# Patient Record
Sex: Female | Born: 1955
Health system: Southern US, Community
[De-identification: ages and names within clinical notes are randomized; demographics above are authoritative.]

## PROBLEM LIST (undated history)

## (undated) DIAGNOSIS — M1990 Unspecified osteoarthritis, unspecified site: Secondary | ICD-10-CM

## (undated) DIAGNOSIS — L719 Rosacea, unspecified: Secondary | ICD-10-CM

## (undated) DIAGNOSIS — E1169 Type 2 diabetes mellitus with other specified complication: Secondary | ICD-10-CM

## (undated) DIAGNOSIS — E7800 Pure hypercholesterolemia, unspecified: Secondary | ICD-10-CM

## (undated) DIAGNOSIS — I10 Essential (primary) hypertension: Secondary | ICD-10-CM

## (undated) DIAGNOSIS — R42 Dizziness and giddiness: Secondary | ICD-10-CM

## (undated) HISTORY — DX: Essential (primary) hypertension: I10

## (undated) HISTORY — PX: BUNIONECTOMY: SHX129

## (undated) HISTORY — DX: Rosacea, unspecified: L71.9

## (undated) HISTORY — DX: Unspecified osteoarthritis, unspecified site: M19.90

## (undated) HISTORY — DX: Type 2 diabetes mellitus with other specified complication: E11.69

## (undated) HISTORY — DX: Pure hypercholesterolemia, unspecified: E78.00

## (undated) HISTORY — DX: Dizziness and giddiness: R42

---

## 2001-07-26 ENCOUNTER — Other Ambulatory Visit: Admission: RE | Admit: 2001-07-26 | Discharge: 2001-07-26 | Payer: Self-pay | Admitting: Gynecology

## 2002-03-22 ENCOUNTER — Other Ambulatory Visit: Admission: RE | Admit: 2002-03-22 | Discharge: 2002-03-22 | Payer: Self-pay | Admitting: Gynecology

## 2003-04-05 ENCOUNTER — Other Ambulatory Visit: Admission: RE | Admit: 2003-04-05 | Discharge: 2003-04-05 | Payer: Self-pay | Admitting: Gynecology

## 2004-04-06 ENCOUNTER — Other Ambulatory Visit: Admission: RE | Admit: 2004-04-06 | Discharge: 2004-04-06 | Payer: Self-pay | Admitting: Gynecology

## 2005-05-05 ENCOUNTER — Other Ambulatory Visit: Admission: RE | Admit: 2005-05-05 | Discharge: 2005-05-05 | Payer: Self-pay | Admitting: Gynecology

## 2005-11-15 ENCOUNTER — Other Ambulatory Visit: Admission: RE | Admit: 2005-11-15 | Discharge: 2005-11-15 | Payer: Self-pay | Admitting: Gynecology

## 2006-08-08 ENCOUNTER — Other Ambulatory Visit: Admission: RE | Admit: 2006-08-08 | Discharge: 2006-08-08 | Payer: Self-pay | Admitting: Family Medicine

## 2007-09-27 ENCOUNTER — Other Ambulatory Visit: Admission: RE | Admit: 2007-09-27 | Discharge: 2007-09-27 | Payer: Self-pay | Admitting: Family Medicine

## 2008-10-14 ENCOUNTER — Other Ambulatory Visit: Admission: RE | Admit: 2008-10-14 | Discharge: 2008-10-14 | Payer: Self-pay | Admitting: Family Medicine

## 2009-11-20 ENCOUNTER — Other Ambulatory Visit: Admission: RE | Admit: 2009-11-20 | Discharge: 2009-11-20 | Payer: Self-pay | Admitting: Family Medicine

## 2013-03-12 ENCOUNTER — Other Ambulatory Visit (HOSPITAL_COMMUNITY)
Admission: RE | Admit: 2013-03-12 | Discharge: 2013-03-12 | Disposition: A | Discharge status: Home / Self Care | Payer: Managed Care, Other (non HMO) | Source: Ambulatory Visit | Attending: Family Medicine | Admitting: Family Medicine

## 2013-03-12 ENCOUNTER — Other Ambulatory Visit: Payer: Self-pay | Admitting: Family Medicine

## 2013-03-12 DIAGNOSIS — Z124 Encounter for screening for malignant neoplasm of cervix: Secondary | ICD-10-CM | POA: Insufficient documentation

## 2014-06-20 ENCOUNTER — Other Ambulatory Visit (HOSPITAL_COMMUNITY): Payer: Self-pay | Admitting: Cardiology

## 2014-06-20 DIAGNOSIS — H539 Unspecified visual disturbance: Secondary | ICD-10-CM

## 2014-06-24 ENCOUNTER — Ambulatory Visit (HOSPITAL_COMMUNITY): Payer: Managed Care, Other (non HMO) | Attending: Cardiology | Admitting: Radiology

## 2014-06-24 ENCOUNTER — Other Ambulatory Visit (HOSPITAL_COMMUNITY): Payer: Self-pay | Admitting: Family Medicine

## 2014-06-24 ENCOUNTER — Ambulatory Visit (HOSPITAL_BASED_OUTPATIENT_CLINIC_OR_DEPARTMENT_OTHER): Payer: Managed Care, Other (non HMO) | Admitting: Cardiology

## 2014-06-24 DIAGNOSIS — H5340 Unspecified visual field defects: Secondary | ICD-10-CM

## 2014-06-24 DIAGNOSIS — H539 Unspecified visual disturbance: Secondary | ICD-10-CM

## 2014-06-24 DIAGNOSIS — H53129 Transient visual loss, unspecified eye: Secondary | ICD-10-CM | POA: Insufficient documentation

## 2014-06-24 NOTE — Progress Notes (Signed)
Carotid Duplex performed. 

## 2014-06-24 NOTE — Progress Notes (Signed)
Echo performed. 

## 2014-07-01 ENCOUNTER — Other Ambulatory Visit: Payer: Self-pay | Admitting: Family Medicine

## 2014-07-01 DIAGNOSIS — H5340 Unspecified visual field defects: Secondary | ICD-10-CM

## 2014-07-06 ENCOUNTER — Ambulatory Visit
Admission: RE | Admit: 2014-07-06 | Discharge: 2014-07-06 | Disposition: A | Discharge status: Home / Self Care | Payer: Managed Care, Other (non HMO) | Source: Ambulatory Visit | Attending: Family Medicine | Admitting: Family Medicine

## 2014-07-06 DIAGNOSIS — H5340 Unspecified visual field defects: Secondary | ICD-10-CM

## 2014-07-06 MED ORDER — GADOBENATE DIMEGLUMINE 529 MG/ML IV SOLN
16.0000 mL | Freq: Once | INTRAVENOUS | Status: AC | PRN
  Administered 2014-07-06: 16 mL via INTRAVENOUS

## 2014-09-11 ENCOUNTER — Ambulatory Visit
Admission: RE | Admit: 2014-09-11 | Discharge: 2014-09-11 | Disposition: A | Discharge status: Home / Self Care | Payer: Managed Care, Other (non HMO) | Source: Ambulatory Visit | Attending: Family Medicine | Admitting: Family Medicine

## 2014-09-11 ENCOUNTER — Other Ambulatory Visit: Payer: Self-pay | Admitting: Family Medicine

## 2014-09-11 DIAGNOSIS — R102 Pelvic and perineal pain: Secondary | ICD-10-CM

## 2016-03-05 DIAGNOSIS — E7800 Pure hypercholesterolemia, unspecified: Secondary | ICD-10-CM | POA: Diagnosis not present

## 2016-03-05 DIAGNOSIS — K210 Gastro-esophageal reflux disease with esophagitis: Secondary | ICD-10-CM | POA: Diagnosis not present

## 2016-03-05 DIAGNOSIS — I10 Essential (primary) hypertension: Secondary | ICD-10-CM | POA: Diagnosis not present

## 2016-04-28 DIAGNOSIS — Z85828 Personal history of other malignant neoplasm of skin: Secondary | ICD-10-CM | POA: Diagnosis not present

## 2016-04-28 DIAGNOSIS — L308 Other specified dermatitis: Secondary | ICD-10-CM | POA: Diagnosis not present

## 2016-04-28 DIAGNOSIS — Z1283 Encounter for screening for malignant neoplasm of skin: Secondary | ICD-10-CM | POA: Diagnosis not present

## 2016-04-28 DIAGNOSIS — Z08 Encounter for follow-up examination after completed treatment for malignant neoplasm: Secondary | ICD-10-CM | POA: Diagnosis not present

## 2016-06-15 DIAGNOSIS — H524 Presbyopia: Secondary | ICD-10-CM | POA: Diagnosis not present

## 2016-06-15 DIAGNOSIS — H40013 Open angle with borderline findings, low risk, bilateral: Secondary | ICD-10-CM | POA: Diagnosis not present

## 2016-06-15 DIAGNOSIS — H2513 Age-related nuclear cataract, bilateral: Secondary | ICD-10-CM | POA: Diagnosis not present

## 2016-06-15 DIAGNOSIS — H25013 Cortical age-related cataract, bilateral: Secondary | ICD-10-CM | POA: Diagnosis not present

## 2016-06-15 DIAGNOSIS — H43811 Vitreous degeneration, right eye: Secondary | ICD-10-CM | POA: Diagnosis not present

## 2016-09-07 ENCOUNTER — Other Ambulatory Visit (HOSPITAL_COMMUNITY)
Admission: RE | Admit: 2016-09-07 | Discharge: 2016-09-07 | Disposition: A | Discharge status: Home / Self Care | Payer: BLUE CROSS/BLUE SHIELD | Source: Ambulatory Visit | Attending: Family Medicine | Admitting: Family Medicine

## 2016-09-07 ENCOUNTER — Other Ambulatory Visit: Payer: Self-pay | Admitting: Family Medicine

## 2016-09-07 DIAGNOSIS — E7800 Pure hypercholesterolemia, unspecified: Secondary | ICD-10-CM | POA: Diagnosis not present

## 2016-09-07 DIAGNOSIS — K219 Gastro-esophageal reflux disease without esophagitis: Secondary | ICD-10-CM | POA: Diagnosis not present

## 2016-09-07 DIAGNOSIS — Z1151 Encounter for screening for human papillomavirus (HPV): Secondary | ICD-10-CM | POA: Diagnosis not present

## 2016-09-07 DIAGNOSIS — Z124 Encounter for screening for malignant neoplasm of cervix: Secondary | ICD-10-CM | POA: Insufficient documentation

## 2016-09-07 DIAGNOSIS — I10 Essential (primary) hypertension: Secondary | ICD-10-CM | POA: Diagnosis not present

## 2016-09-07 DIAGNOSIS — Z0000 Encounter for general adult medical examination without abnormal findings: Secondary | ICD-10-CM | POA: Diagnosis not present

## 2016-09-08 LAB — CYTOLOGY - PAP
DIAGNOSIS: NEGATIVE
HPV: NOT DETECTED

## 2017-01-13 DIAGNOSIS — H40012 Open angle with borderline findings, low risk, left eye: Secondary | ICD-10-CM | POA: Diagnosis not present

## 2017-01-13 DIAGNOSIS — H40013 Open angle with borderline findings, low risk, bilateral: Secondary | ICD-10-CM | POA: Diagnosis not present

## 2017-01-13 DIAGNOSIS — H04123 Dry eye syndrome of bilateral lacrimal glands: Secondary | ICD-10-CM | POA: Diagnosis not present

## 2017-01-13 DIAGNOSIS — H40011 Open angle with borderline findings, low risk, right eye: Secondary | ICD-10-CM | POA: Diagnosis not present

## 2017-01-13 DIAGNOSIS — M3501 Sicca syndrome with keratoconjunctivitis: Secondary | ICD-10-CM | POA: Diagnosis not present

## 2017-01-17 DIAGNOSIS — Z1231 Encounter for screening mammogram for malignant neoplasm of breast: Secondary | ICD-10-CM | POA: Diagnosis not present

## 2017-02-09 DIAGNOSIS — Z23 Encounter for immunization: Secondary | ICD-10-CM | POA: Diagnosis not present

## 2017-04-19 DIAGNOSIS — H40011 Open angle with borderline findings, low risk, right eye: Secondary | ICD-10-CM | POA: Diagnosis not present

## 2017-05-19 ENCOUNTER — Ambulatory Visit
Admission: RE | Admit: 2017-05-19 | Discharge: 2017-05-19 | Disposition: A | Discharge status: Home / Self Care | Payer: BLUE CROSS/BLUE SHIELD | Source: Ambulatory Visit | Attending: Family Medicine | Admitting: Family Medicine

## 2017-05-19 ENCOUNTER — Other Ambulatory Visit: Payer: Self-pay | Admitting: Family Medicine

## 2017-05-19 DIAGNOSIS — M7662 Achilles tendinitis, left leg: Secondary | ICD-10-CM

## 2017-05-19 DIAGNOSIS — K1370 Unspecified lesions of oral mucosa: Secondary | ICD-10-CM | POA: Diagnosis not present

## 2017-05-19 DIAGNOSIS — M25572 Pain in left ankle and joints of left foot: Secondary | ICD-10-CM | POA: Diagnosis not present

## 2017-05-27 DIAGNOSIS — Z23 Encounter for immunization: Secondary | ICD-10-CM | POA: Diagnosis not present

## 2017-06-29 DIAGNOSIS — H40011 Open angle with borderline findings, low risk, right eye: Secondary | ICD-10-CM | POA: Diagnosis not present

## 2017-06-29 DIAGNOSIS — M3501 Sicca syndrome with keratoconjunctivitis: Secondary | ICD-10-CM | POA: Diagnosis not present

## 2017-06-29 DIAGNOSIS — H04123 Dry eye syndrome of bilateral lacrimal glands: Secondary | ICD-10-CM | POA: Diagnosis not present

## 2017-06-29 DIAGNOSIS — H40013 Open angle with borderline findings, low risk, bilateral: Secondary | ICD-10-CM | POA: Diagnosis not present

## 2017-06-29 DIAGNOSIS — H40012 Open angle with borderline findings, low risk, left eye: Secondary | ICD-10-CM | POA: Diagnosis not present

## 2017-06-29 DIAGNOSIS — H01003 Unspecified blepharitis right eye, unspecified eyelid: Secondary | ICD-10-CM | POA: Diagnosis not present

## 2017-09-13 DIAGNOSIS — E2839 Other primary ovarian failure: Secondary | ICD-10-CM | POA: Diagnosis not present

## 2017-09-13 DIAGNOSIS — I10 Essential (primary) hypertension: Secondary | ICD-10-CM | POA: Diagnosis not present

## 2017-09-13 DIAGNOSIS — Z0000 Encounter for general adult medical examination without abnormal findings: Secondary | ICD-10-CM | POA: Diagnosis not present

## 2017-09-13 DIAGNOSIS — E7800 Pure hypercholesterolemia, unspecified: Secondary | ICD-10-CM | POA: Diagnosis not present

## 2017-09-26 ENCOUNTER — Ambulatory Visit (HOSPITAL_COMMUNITY)
Admission: RE | Admit: 2017-09-26 | Discharge: 2017-09-26 | Disposition: A | Discharge status: Home / Self Care | Payer: BLUE CROSS/BLUE SHIELD | Source: Ambulatory Visit | Attending: Gastroenterology | Admitting: Gastroenterology

## 2017-09-26 ENCOUNTER — Other Ambulatory Visit (HOSPITAL_COMMUNITY): Payer: Self-pay | Admitting: Gastroenterology

## 2017-09-26 DIAGNOSIS — Z1211 Encounter for screening for malignant neoplasm of colon: Secondary | ICD-10-CM | POA: Diagnosis not present

## 2017-09-26 DIAGNOSIS — Q438 Other specified congenital malformations of intestine: Secondary | ICD-10-CM

## 2017-09-26 DIAGNOSIS — Z8371 Family history of colonic polyps: Secondary | ICD-10-CM | POA: Diagnosis not present

## 2017-09-26 DIAGNOSIS — K649 Unspecified hemorrhoids: Secondary | ICD-10-CM | POA: Diagnosis not present

## 2017-11-16 DIAGNOSIS — M8588 Other specified disorders of bone density and structure, other site: Secondary | ICD-10-CM | POA: Diagnosis not present

## 2017-11-16 DIAGNOSIS — E2839 Other primary ovarian failure: Secondary | ICD-10-CM | POA: Diagnosis not present

## 2018-01-04 DIAGNOSIS — L821 Other seborrheic keratosis: Secondary | ICD-10-CM | POA: Diagnosis not present

## 2018-01-04 DIAGNOSIS — Z1283 Encounter for screening for malignant neoplasm of skin: Secondary | ICD-10-CM | POA: Diagnosis not present

## 2018-01-19 DIAGNOSIS — Z1231 Encounter for screening mammogram for malignant neoplasm of breast: Secondary | ICD-10-CM | POA: Diagnosis not present

## 2018-03-14 DIAGNOSIS — I10 Essential (primary) hypertension: Secondary | ICD-10-CM | POA: Diagnosis not present

## 2018-03-14 DIAGNOSIS — E7800 Pure hypercholesterolemia, unspecified: Secondary | ICD-10-CM | POA: Diagnosis not present

## 2018-04-12 DIAGNOSIS — S86812A Strain of other muscle(s) and tendon(s) at lower leg level, left leg, initial encounter: Secondary | ICD-10-CM | POA: Diagnosis not present

## 2018-04-24 DIAGNOSIS — H6983 Other specified disorders of Eustachian tube, bilateral: Secondary | ICD-10-CM | POA: Diagnosis not present

## 2018-04-24 DIAGNOSIS — J310 Chronic rhinitis: Secondary | ICD-10-CM | POA: Diagnosis not present

## 2018-07-06 DIAGNOSIS — M3501 Sicca syndrome with keratoconjunctivitis: Secondary | ICD-10-CM | POA: Diagnosis not present

## 2018-07-06 DIAGNOSIS — H04123 Dry eye syndrome of bilateral lacrimal glands: Secondary | ICD-10-CM | POA: Diagnosis not present

## 2018-07-06 DIAGNOSIS — H25013 Cortical age-related cataract, bilateral: Secondary | ICD-10-CM | POA: Diagnosis not present

## 2018-07-06 DIAGNOSIS — H40013 Open angle with borderline findings, low risk, bilateral: Secondary | ICD-10-CM | POA: Diagnosis not present

## 2018-09-14 DIAGNOSIS — I10 Essential (primary) hypertension: Secondary | ICD-10-CM | POA: Diagnosis not present

## 2018-09-14 DIAGNOSIS — E7800 Pure hypercholesterolemia, unspecified: Secondary | ICD-10-CM | POA: Diagnosis not present

## 2018-09-14 DIAGNOSIS — Z0000 Encounter for general adult medical examination without abnormal findings: Secondary | ICD-10-CM | POA: Diagnosis not present

## 2018-10-10 DIAGNOSIS — H8111 Benign paroxysmal vertigo, right ear: Secondary | ICD-10-CM | POA: Diagnosis not present

## 2018-10-17 DIAGNOSIS — H8111 Benign paroxysmal vertigo, right ear: Secondary | ICD-10-CM | POA: Diagnosis not present

## 2019-01-16 IMAGING — CR DG BE W/ CM - WO/W KUB
14 series · 14 of 14 positions shown · IV contrast (agent unspecified)
Comparison: None.

CLINICAL DATA: Incomplete colonoscopy today.

EXAM:
BE WITH CONTRAST - WITHOUT AND WITH KUB
CONTRAST:  Thin barium per rectum
FLUOROSCOPY TIME:  Fluoroscopy Time:  1 minutes 6 seconds
Radiation Exposure Index (if provided by the fluoroscopic device):
Number of Acquired Spot Images: 0

[t abdomen supine]
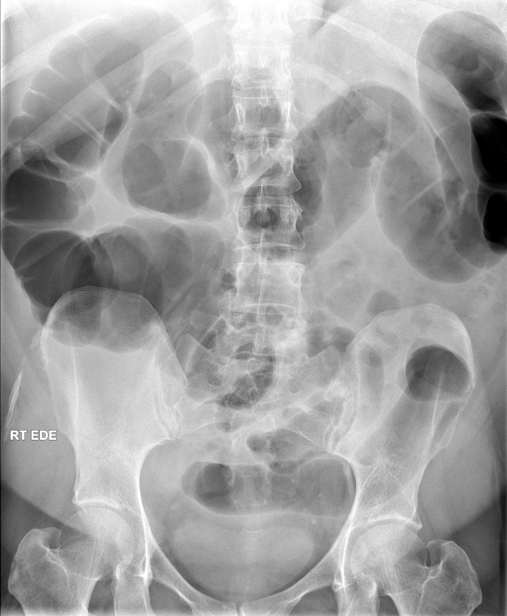

[fluoro_barium singleshot_bw (1 of 12)]
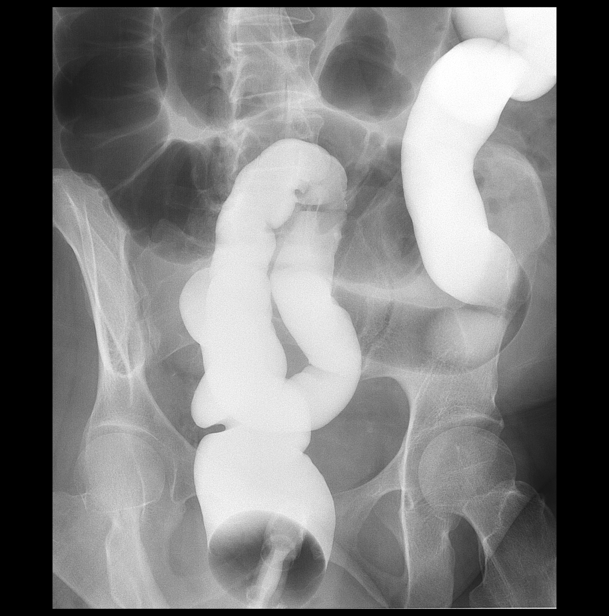

[fluoro_barium singleshot_bw (2 of 12)]
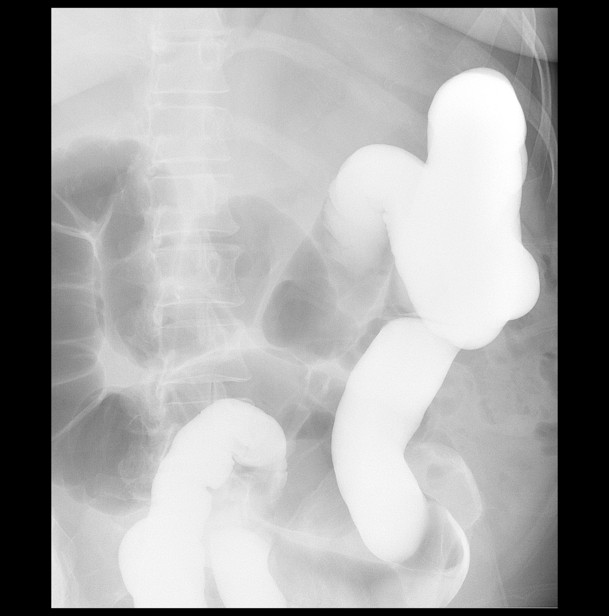

[fluoro_barium singleshot_bw (3 of 12)]
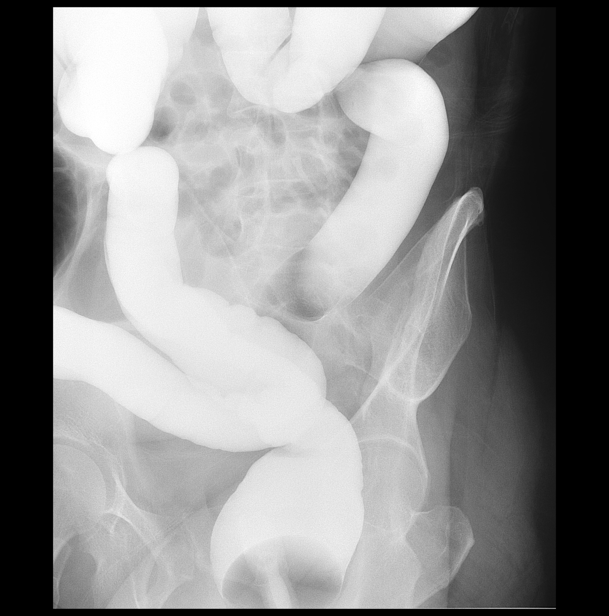

[fluoro_barium singleshot_bw (4 of 12)]
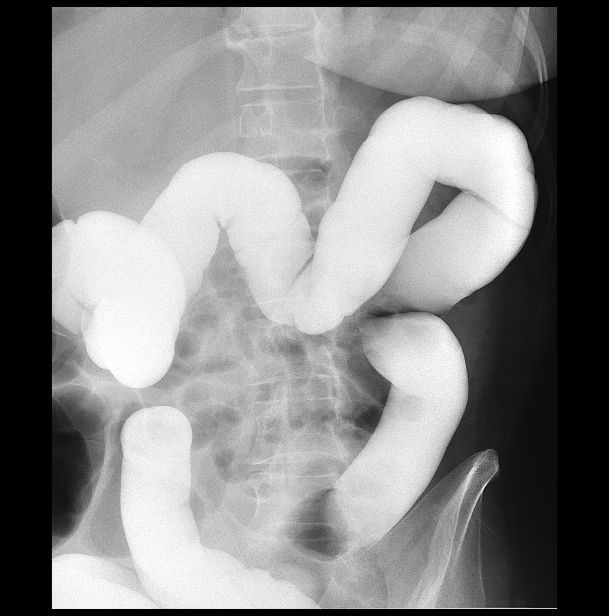

[fluoro_barium singleshot_bw (5 of 12)]
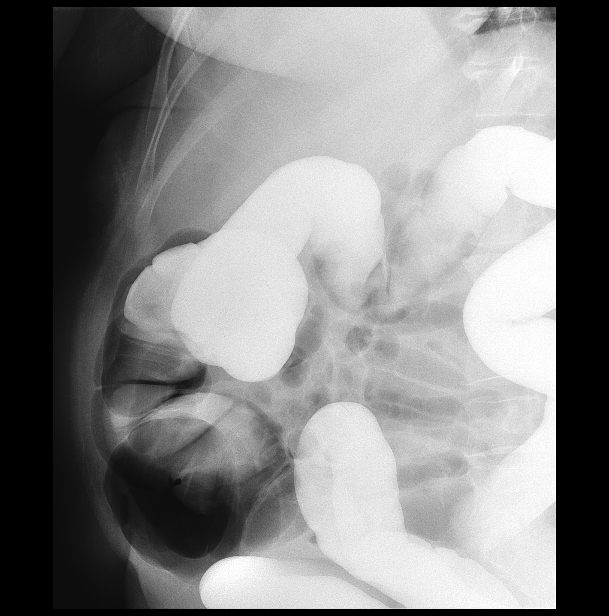

[fluoro_barium singleshot_bw (6 of 12)]
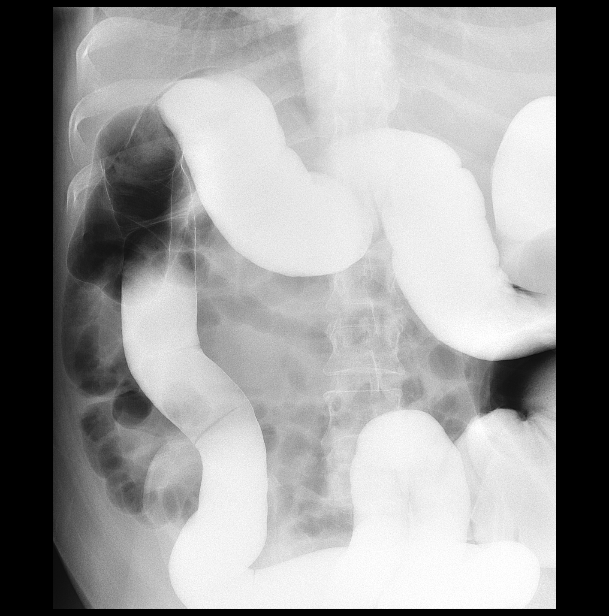

[fluoro_barium singleshot_bw (7 of 12)]
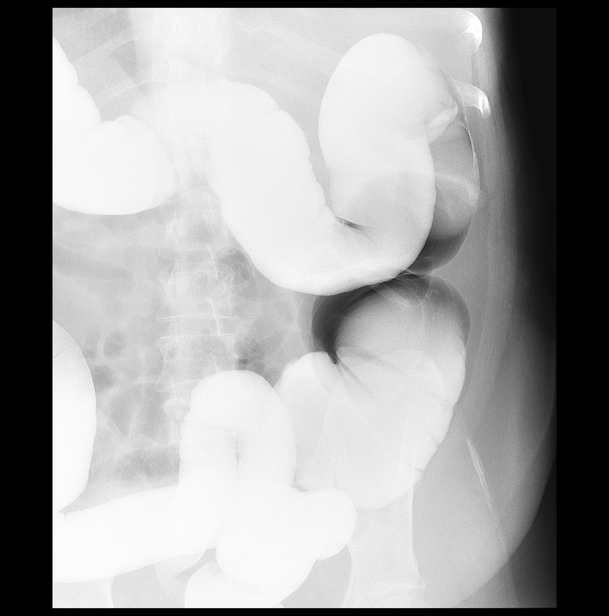

[fluoro_barium singleshot_bw (8 of 12)]
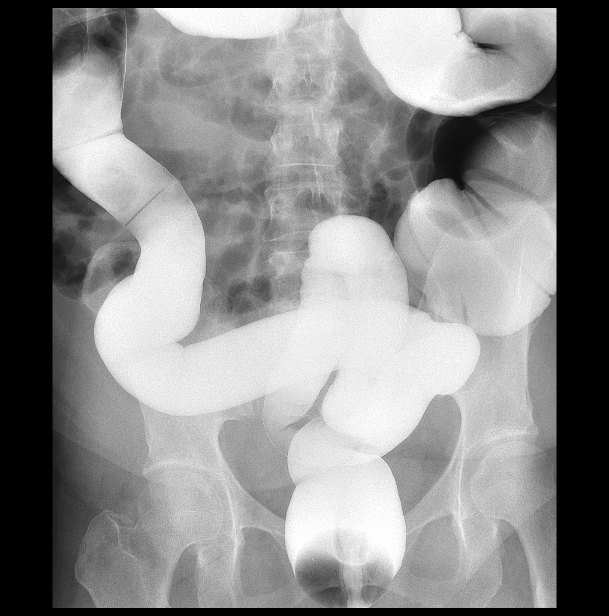

[fluoro_barium singleshot_bw (9 of 12)]
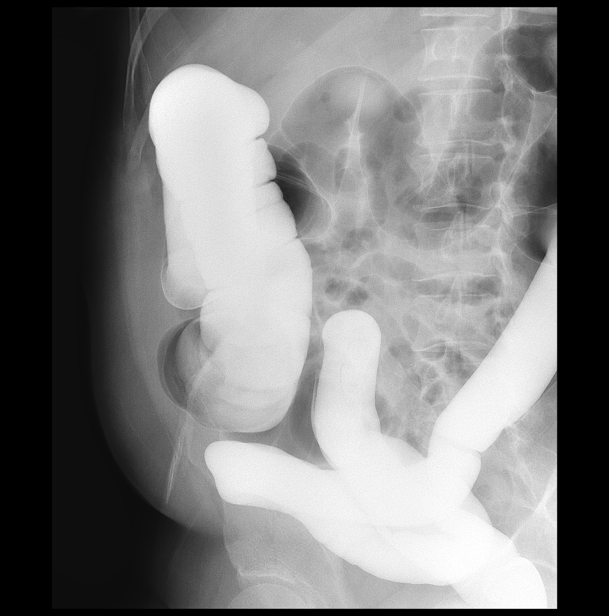

[fluoro_barium singleshot_bw (10 of 12)]
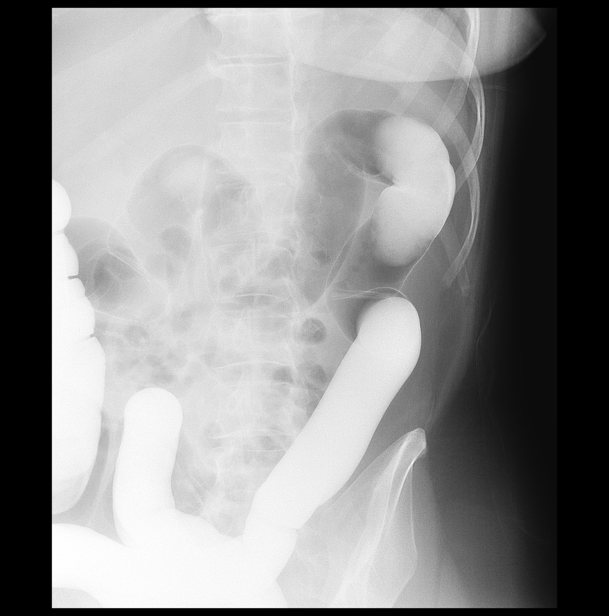

[fluoro_barium singleshot_bw (11 of 12)]
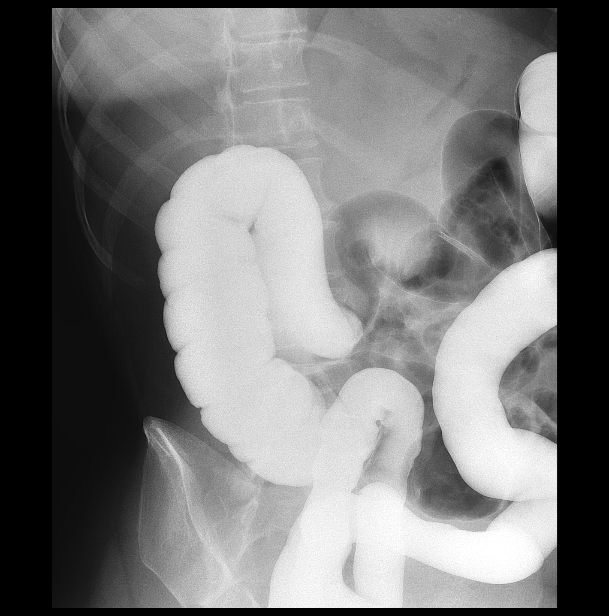

[fluoro_barium singleshot_bw (12 of 12)]
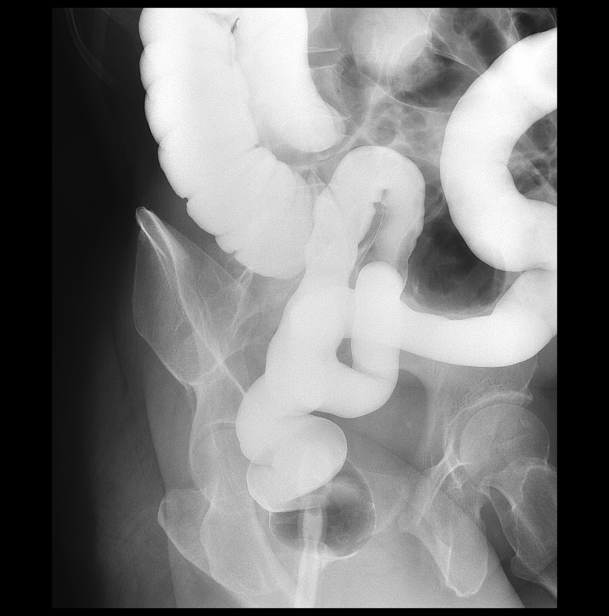

[t abdomen barium ap]
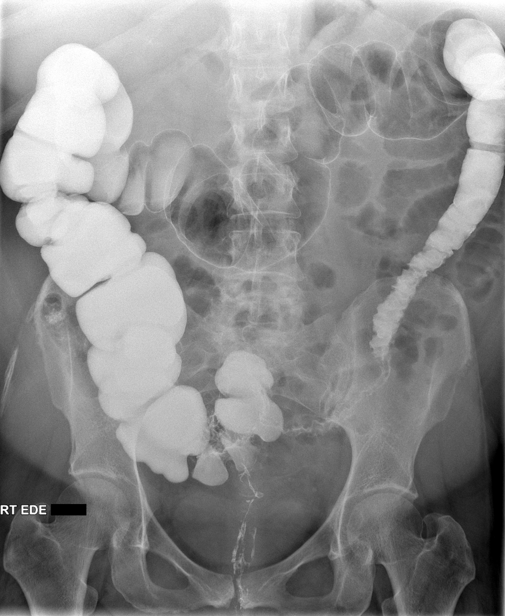

[14 of 14 positions shown; findings below may reference images not displayed]

FINDINGS: Preliminary KUB reveals extensive colonic gas related to incomplete
colonoscopy today. No biopsy was performed at colonoscopy.

Colonic mucosa is smooth. No diverticular change. No polyp or mass.
The entire colon was filled with contrast.

Postvoid imaging reveals no significant abnormality.
IMPRESSION: Negative single contrast barium enema. Study is limited by large
amount of colonic gas.

## 2019-03-15 DIAGNOSIS — E7800 Pure hypercholesterolemia, unspecified: Secondary | ICD-10-CM | POA: Diagnosis not present

## 2019-03-15 DIAGNOSIS — I10 Essential (primary) hypertension: Secondary | ICD-10-CM | POA: Diagnosis not present

## 2019-03-22 DIAGNOSIS — Z1231 Encounter for screening mammogram for malignant neoplasm of breast: Secondary | ICD-10-CM | POA: Diagnosis not present

## 2019-03-28 DIAGNOSIS — Z1283 Encounter for screening for malignant neoplasm of skin: Secondary | ICD-10-CM | POA: Diagnosis not present

## 2019-03-28 DIAGNOSIS — L570 Actinic keratosis: Secondary | ICD-10-CM | POA: Diagnosis not present

## 2019-03-28 DIAGNOSIS — X32XXXA Exposure to sunlight, initial encounter: Secondary | ICD-10-CM | POA: Diagnosis not present

## 2019-03-28 DIAGNOSIS — L821 Other seborrheic keratosis: Secondary | ICD-10-CM | POA: Diagnosis not present

## 2019-04-20 DIAGNOSIS — I10 Essential (primary) hypertension: Secondary | ICD-10-CM | POA: Diagnosis not present

## 2019-05-14 DIAGNOSIS — R222 Localized swelling, mass and lump, trunk: Secondary | ICD-10-CM | POA: Diagnosis not present

## 2019-05-17 ENCOUNTER — Other Ambulatory Visit: Payer: Self-pay | Admitting: Family Medicine

## 2019-05-17 DIAGNOSIS — R222 Localized swelling, mass and lump, trunk: Secondary | ICD-10-CM

## 2019-05-18 ENCOUNTER — Ambulatory Visit
Admission: RE | Admit: 2019-05-18 | Discharge: 2019-05-18 | Disposition: A | Discharge status: Home / Self Care | Payer: BLUE CROSS/BLUE SHIELD | Source: Ambulatory Visit | Attending: Family Medicine | Admitting: Family Medicine

## 2019-05-18 DIAGNOSIS — D171 Benign lipomatous neoplasm of skin and subcutaneous tissue of trunk: Secondary | ICD-10-CM | POA: Diagnosis not present

## 2019-05-18 DIAGNOSIS — R222 Localized swelling, mass and lump, trunk: Secondary | ICD-10-CM

## 2019-07-12 DIAGNOSIS — H2513 Age-related nuclear cataract, bilateral: Secondary | ICD-10-CM | POA: Diagnosis not present

## 2019-07-12 DIAGNOSIS — H524 Presbyopia: Secondary | ICD-10-CM | POA: Diagnosis not present

## 2019-07-12 DIAGNOSIS — H40013 Open angle with borderline findings, low risk, bilateral: Secondary | ICD-10-CM | POA: Diagnosis not present

## 2019-07-12 DIAGNOSIS — H25013 Cortical age-related cataract, bilateral: Secondary | ICD-10-CM | POA: Diagnosis not present

## 2019-07-12 DIAGNOSIS — H04123 Dry eye syndrome of bilateral lacrimal glands: Secondary | ICD-10-CM | POA: Diagnosis not present

## 2019-09-20 DIAGNOSIS — Z23 Encounter for immunization: Secondary | ICD-10-CM | POA: Diagnosis not present

## 2019-09-20 DIAGNOSIS — Z0000 Encounter for general adult medical examination without abnormal findings: Secondary | ICD-10-CM | POA: Diagnosis not present

## 2019-09-20 DIAGNOSIS — I10 Essential (primary) hypertension: Secondary | ICD-10-CM | POA: Diagnosis not present

## 2019-09-20 DIAGNOSIS — E7800 Pure hypercholesterolemia, unspecified: Secondary | ICD-10-CM | POA: Diagnosis not present

## 2020-01-23 ENCOUNTER — Other Ambulatory Visit: Payer: Self-pay

## 2020-01-23 ENCOUNTER — Ambulatory Visit: Payer: BC Managed Care – PPO | Admitting: Podiatry

## 2020-01-23 DIAGNOSIS — M6740 Ganglion, unspecified site: Secondary | ICD-10-CM

## 2020-01-23 DIAGNOSIS — B351 Tinea unguium: Secondary | ICD-10-CM

## 2020-01-23 MED ORDER — TERBINAFINE HCL 250 MG PO TABS: 250.0000 mg | ORAL_TABLET | Freq: Every day | ORAL | 0 refills | Status: DC

## 2020-01-23 NOTE — Patient Instructions (Signed)
Mucoid Cyst

## 2020-01-27 NOTE — Progress Notes (Signed)
   Subjective: 64 y.o. female presenting today as a new patient with a chief complaint of sharp pain noted to the left 2nd digit that has been gradually worsening for the past 5 years. She reports a cyst-like lesion present which produces clear fluid when lanced. Touching the area increases the pain. She has had the lesion lanced in the past with no significant relief.  She also notes possible nail fungus of the nails bilaterally that have been present for the past several months. She reports thickening and discoloration of the nails. She denies any modifying factors and has not had any treatment for the symptoms. Patient is here for further evaluation and treatment.   No past medical history on file.  Objective: Physical Exam General: The patient is alert and oriented x3 in no acute distress.  Dermatology: Hyperkeratotic, discolored, thickened, onychodystrophy noted to nails 1-5 bilaterally. Cystic lesion noted overlying DIPJ approximately 2 mm in diameter. Skin is warm, dry and supple bilateral lower extremities. Negative for open lesions or macerations.  Vascular: Palpable pedal pulses bilaterally. No edema or erythema noted. Capillary refill within normal limits.  Neurological: Epicritic and protective threshold grossly intact bilaterally.   Musculoskeletal Exam: Range of motion within normal limits to all pedal and ankle joints bilateral. Muscle strength 5/5 in all groups bilateral.   Assessment: #1 Onychomycosis noted to nails 1-5 bilaterally #2 Hyperkeratotic nails noted 1-5 bilaterally  #3 Mucoid cyst left 2nd DIPJ  Plan of Care:  #1 Patient was evaluated. #2 Recommended simply observing mucoid cyst for now. Patient does not want arthroplasty of the toe at this time.  #3 Prescription for Lamisil 250 mg #90 provided to patient.  #4 Inquired about liver function and patient declines any known history of issue or problems.  #5 Return to clinic as needed.   Works at [employer]  [place] in [employer].    Felecia Shelling, DPM [facility]  Dr. Felecia Shelling, DPM    [address]                                        [place], [place] [address]                Office [phone]  Fax [phone]

## 2020-02-08 DIAGNOSIS — Z20822 Contact with and (suspected) exposure to covid-19: Secondary | ICD-10-CM | POA: Diagnosis not present

## 2020-03-20 DIAGNOSIS — E7800 Pure hypercholesterolemia, unspecified: Secondary | ICD-10-CM | POA: Diagnosis not present

## 2020-03-20 DIAGNOSIS — I10 Essential (primary) hypertension: Secondary | ICD-10-CM | POA: Diagnosis not present

## 2020-03-25 DIAGNOSIS — Z1231 Encounter for screening mammogram for malignant neoplasm of breast: Secondary | ICD-10-CM | POA: Diagnosis not present

## 2020-04-07 DIAGNOSIS — R928 Other abnormal and inconclusive findings on diagnostic imaging of breast: Secondary | ICD-10-CM | POA: Diagnosis not present

## 2020-06-09 ENCOUNTER — Ambulatory Visit: Payer: BC Managed Care – PPO | Admitting: Podiatry

## 2020-06-09 ENCOUNTER — Other Ambulatory Visit: Payer: Self-pay

## 2020-06-09 ENCOUNTER — Ambulatory Visit (INDEPENDENT_AMBULATORY_CARE_PROVIDER_SITE_OTHER): Payer: BC Managed Care – PPO

## 2020-06-09 DIAGNOSIS — G5762 Lesion of plantar nerve, left lower limb: Secondary | ICD-10-CM

## 2020-06-09 DIAGNOSIS — M6740 Ganglion, unspecified site: Secondary | ICD-10-CM | POA: Diagnosis not present

## 2020-06-09 NOTE — Patient Instructions (Signed)

## 2020-06-09 NOTE — Progress Notes (Signed)
   Subjective: 64 y.o. female presenting today for evaluation of a new complaint regarding a knot on the bottom of the forefoot.  Patient states that when she walks barefoot on hardwood floors it makes it difficult because she feels like there is a knot on the bottom of her second toe.  It is very symptomatic when barefoot.  Patient states that is when she wears good supportive sneakers she does not feel anything.  This is been intermittent has been ongoing for the last couple of years. There has been no change to the mucoid cyst overlying the DIPJ of the second digit.  No past medical history on file.  Objective: Physical Exam General: The patient is alert and oriented x3 in no acute distress.  Dermatology:  Cystic lesion noted overlying DIPJ approximately 2 mm in diameter. Skin is warm, dry and supple bilateral lower extremities. Negative for open lesions or macerations.  Vascular: Palpable pedal pulses bilaterally. No edema or erythema noted. Capillary refill within normal limits.  Neurological: Epicritic and protective threshold grossly intact bilaterally.   Musculoskeletal Exam: Range of motion within normal limits to all pedal and ankle joints bilateral. Muscle strength 5/5 in all groups bilateral.  There is some pain on palpation and lateral compression of the second intermetatarsal region of the left foot consistent with findings of a Morton's neuroma  Assessment: #1 Morton's neuroma second interspace left #2 mucoid cyst left 2nd DIPJ  Plan of Care:  #1 Patient was evaluated. #2 continue observing mucoid cyst for now. Patient does not want arthroplasty of the toe at this time.  #3 patient has completed the Lamisil 250 mg #90 daily as prescribed.  There is significant improvement noted #4 recommend conservative treatment at this time.  Patient states that the Morton's neuroma is not necessarily painful but she does feel when barefoot.  Recommend good supportive shoes even in the  house #5 return to clinic as needed  Works at [employer] [place] in [facility] department.    Felecia Shelling, DPM [facility]  Dr. Felecia Shelling, DPM    [address]                                        [place], [place] [address]                Office [phone]  Fax [phone]

## 2020-07-21 DIAGNOSIS — H25013 Cortical age-related cataract, bilateral: Secondary | ICD-10-CM | POA: Diagnosis not present

## 2020-07-21 DIAGNOSIS — H2513 Age-related nuclear cataract, bilateral: Secondary | ICD-10-CM | POA: Diagnosis not present

## 2020-07-21 DIAGNOSIS — H04123 Dry eye syndrome of bilateral lacrimal glands: Secondary | ICD-10-CM | POA: Diagnosis not present

## 2020-07-21 DIAGNOSIS — H40013 Open angle with borderline findings, low risk, bilateral: Secondary | ICD-10-CM | POA: Diagnosis not present

## 2020-09-22 DIAGNOSIS — Z0000 Encounter for general adult medical examination without abnormal findings: Secondary | ICD-10-CM | POA: Diagnosis not present

## 2020-09-22 DIAGNOSIS — E7800 Pure hypercholesterolemia, unspecified: Secondary | ICD-10-CM | POA: Diagnosis not present

## 2020-09-22 DIAGNOSIS — I10 Essential (primary) hypertension: Secondary | ICD-10-CM | POA: Diagnosis not present

## 2020-09-22 DIAGNOSIS — K219 Gastro-esophageal reflux disease without esophagitis: Secondary | ICD-10-CM | POA: Diagnosis not present

## 2021-03-19 DIAGNOSIS — E7800 Pure hypercholesterolemia, unspecified: Secondary | ICD-10-CM | POA: Diagnosis not present

## 2021-03-19 DIAGNOSIS — I10 Essential (primary) hypertension: Secondary | ICD-10-CM | POA: Diagnosis not present

## 2021-03-23 IMAGING — US ULTRASOUND ABDOMEN LIMITED
1 series · 9 of 9 positions shown · non-contrast
Comparison: None.

CLINICAL DATA: 63-year-old presenting with a palpable lump in the
LEFT upper abdomen which she initially noted approximately 1 week
ago.

EXAM:
ULTRASOUND ABDOMEN LIMITED

[Series 1: ultrasound abdomen limited · 0.07mm/px · 9 acquisitions, 9 frames shown]
[im 1/9]
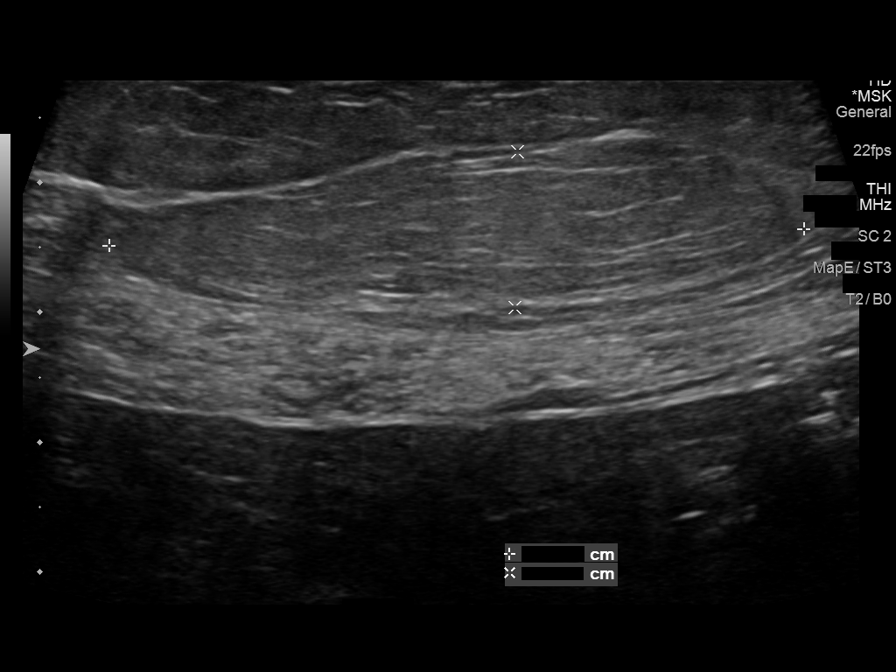
[im 2/9]
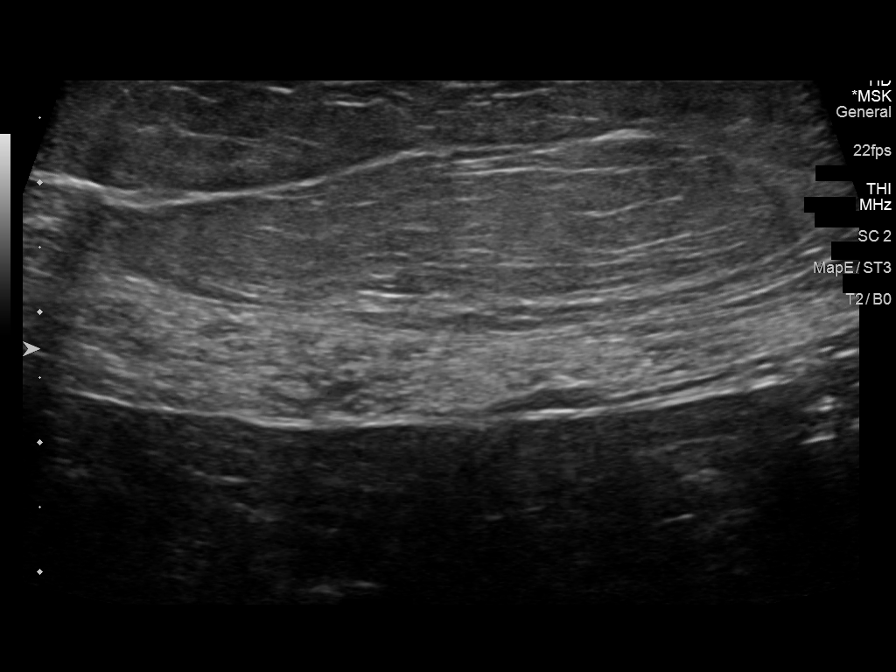
[im 3/9]
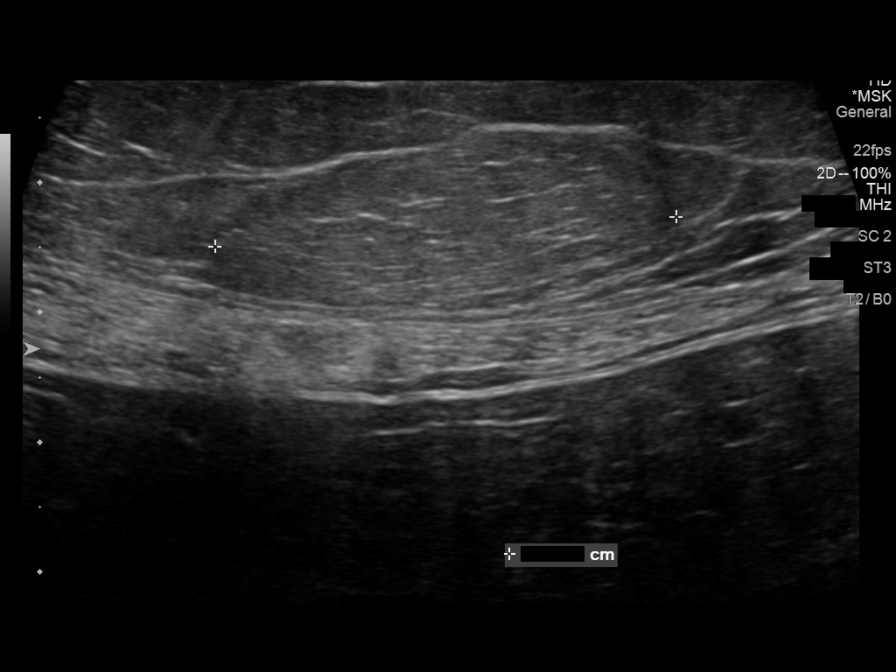
[im 4/9]
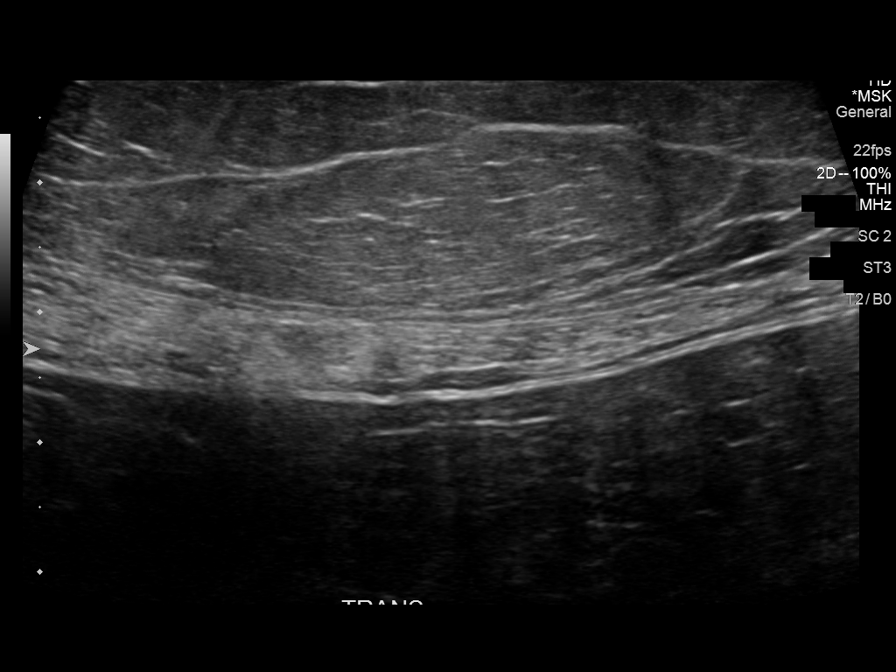
[im 5/9]
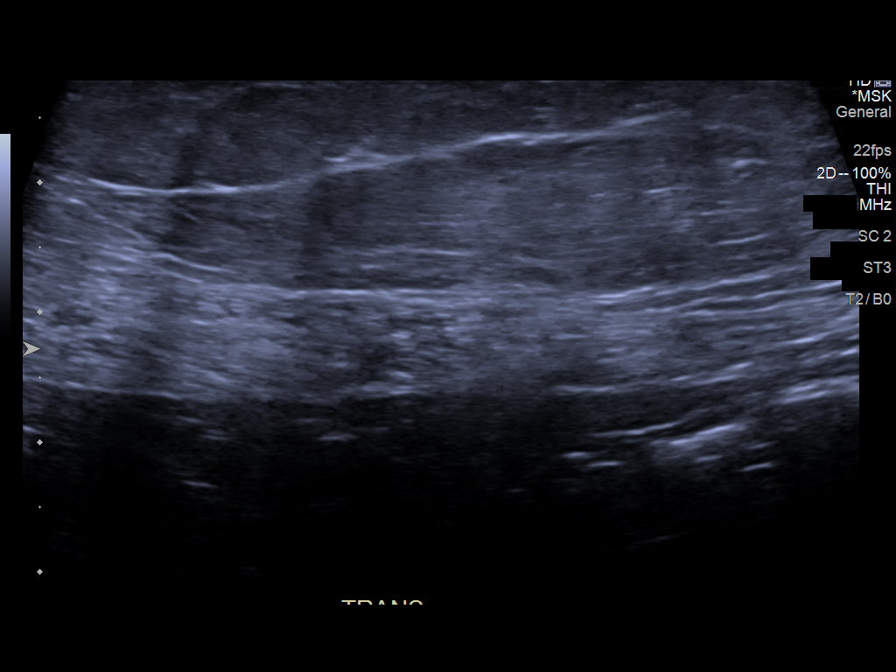
[im 6/9]
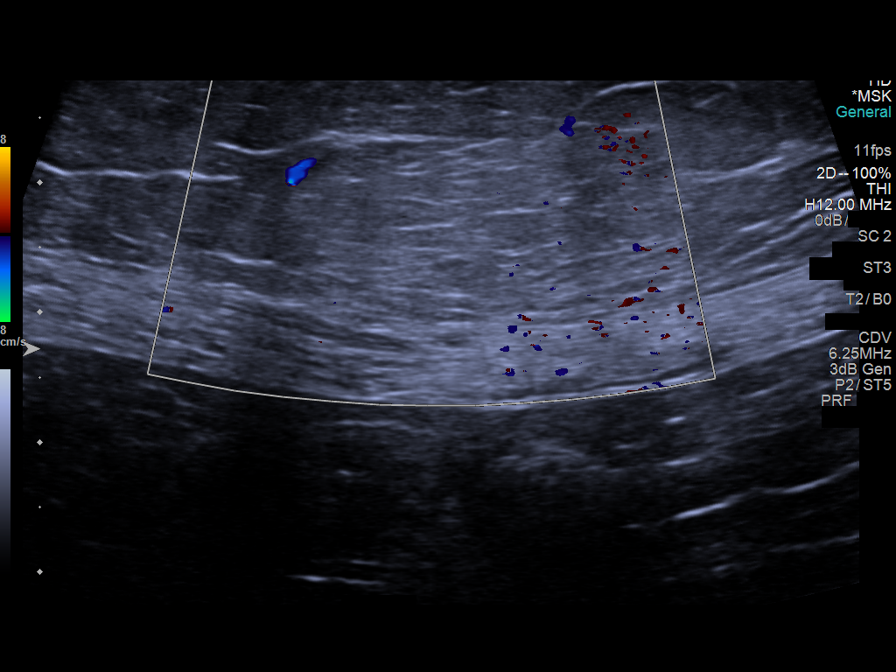
[im 7/9]
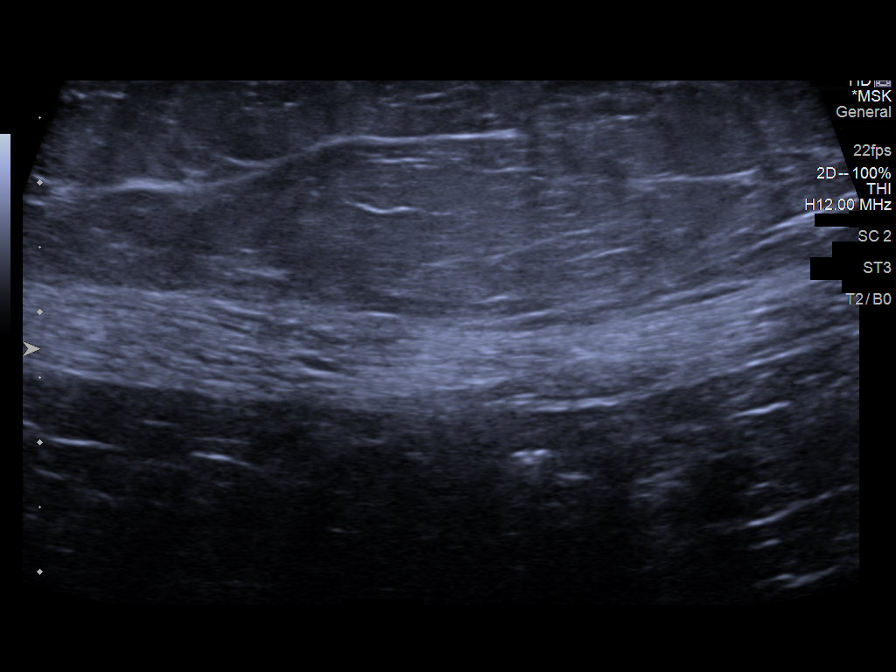
[im 8/9]
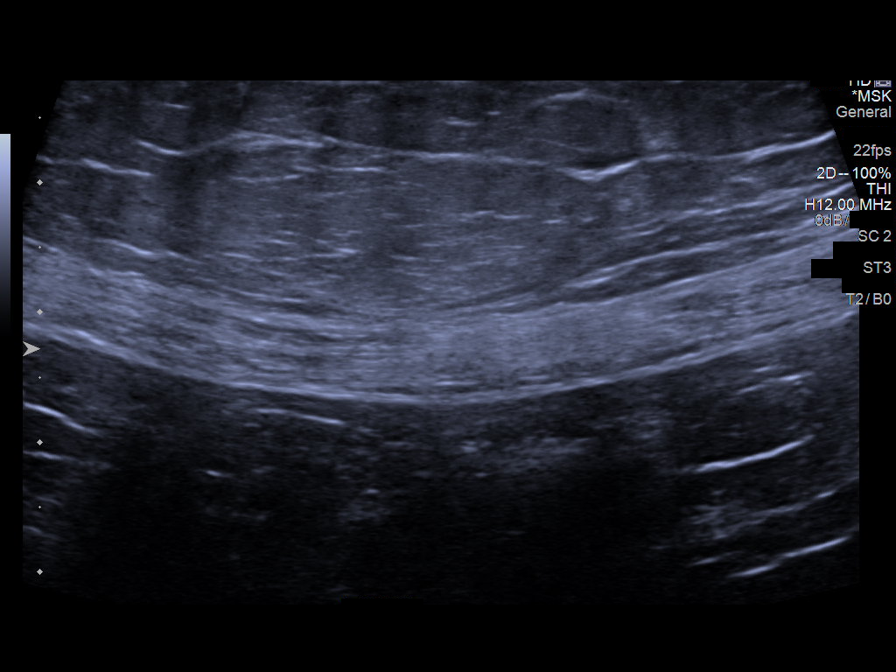
[im 9/9]
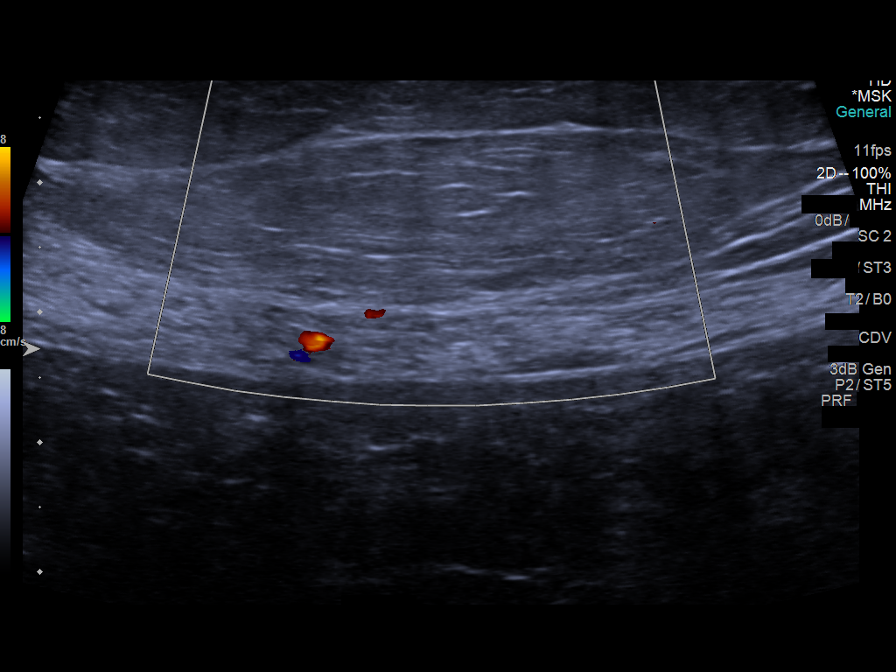

[9 of 9 positions shown; findings below may reference images not displayed]

FINDINGS: Corresponding to the palpable concern is a circumscribed mass
containing fat in the subcutaneous tissues measuring approximately
5.4 x 1.2 x 3.6 cm, demonstrating no color Doppler flow. The
underlying rectus muscle is intact and there is no evidence of
hernia in this location.
IMPRESSION: Benign subcutaneous lipoma in the LEFT upper abdomen which accounts
for the palpable concern.

## 2021-04-07 DIAGNOSIS — Z1231 Encounter for screening mammogram for malignant neoplasm of breast: Secondary | ICD-10-CM | POA: Diagnosis not present

## 2021-07-21 DIAGNOSIS — H04123 Dry eye syndrome of bilateral lacrimal glands: Secondary | ICD-10-CM | POA: Diagnosis not present

## 2021-07-21 DIAGNOSIS — H524 Presbyopia: Secondary | ICD-10-CM | POA: Diagnosis not present

## 2021-07-21 DIAGNOSIS — H25013 Cortical age-related cataract, bilateral: Secondary | ICD-10-CM | POA: Diagnosis not present

## 2021-07-21 DIAGNOSIS — H2513 Age-related nuclear cataract, bilateral: Secondary | ICD-10-CM | POA: Diagnosis not present

## 2021-07-21 DIAGNOSIS — H40013 Open angle with borderline findings, low risk, bilateral: Secondary | ICD-10-CM | POA: Diagnosis not present

## 2021-08-20 DIAGNOSIS — M25511 Pain in right shoulder: Secondary | ICD-10-CM | POA: Diagnosis not present

## 2021-10-07 DIAGNOSIS — E7800 Pure hypercholesterolemia, unspecified: Secondary | ICD-10-CM | POA: Diagnosis not present

## 2021-10-07 DIAGNOSIS — Z124 Encounter for screening for malignant neoplasm of cervix: Secondary | ICD-10-CM | POA: Diagnosis not present

## 2021-10-07 DIAGNOSIS — Z0000 Encounter for general adult medical examination without abnormal findings: Secondary | ICD-10-CM | POA: Diagnosis not present

## 2021-10-07 DIAGNOSIS — K2100 Gastro-esophageal reflux disease with esophagitis, without bleeding: Secondary | ICD-10-CM | POA: Diagnosis not present

## 2021-10-07 DIAGNOSIS — I10 Essential (primary) hypertension: Secondary | ICD-10-CM | POA: Diagnosis not present

## 2022-01-04 DIAGNOSIS — H0015 Chalazion left lower eyelid: Secondary | ICD-10-CM | POA: Diagnosis not present

## 2022-01-04 DIAGNOSIS — H524 Presbyopia: Secondary | ICD-10-CM | POA: Diagnosis not present

## 2022-02-01 DIAGNOSIS — H524 Presbyopia: Secondary | ICD-10-CM | POA: Diagnosis not present

## 2022-02-01 DIAGNOSIS — H0015 Chalazion left lower eyelid: Secondary | ICD-10-CM | POA: Diagnosis not present

## 2022-04-07 DIAGNOSIS — I10 Essential (primary) hypertension: Secondary | ICD-10-CM | POA: Diagnosis not present

## 2022-04-07 DIAGNOSIS — Z23 Encounter for immunization: Secondary | ICD-10-CM | POA: Diagnosis not present

## 2022-04-07 DIAGNOSIS — E7800 Pure hypercholesterolemia, unspecified: Secondary | ICD-10-CM | POA: Diagnosis not present

## 2022-04-13 DIAGNOSIS — Z1231 Encounter for screening mammogram for malignant neoplasm of breast: Secondary | ICD-10-CM | POA: Diagnosis not present

## 2022-06-03 ENCOUNTER — Other Ambulatory Visit: Payer: Self-pay | Admitting: Family Medicine

## 2022-06-03 DIAGNOSIS — I671 Cerebral aneurysm, nonruptured: Secondary | ICD-10-CM

## 2022-06-16 ENCOUNTER — Ambulatory Visit
Admission: RE | Admit: 2022-06-16 | Discharge: 2022-06-16 | Disposition: A | Discharge status: Home / Self Care | Payer: BC Managed Care – PPO | Source: Ambulatory Visit | Attending: Family Medicine | Admitting: Family Medicine

## 2022-06-16 DIAGNOSIS — I671 Cerebral aneurysm, nonruptured: Secondary | ICD-10-CM | POA: Diagnosis not present

## 2022-06-17 DIAGNOSIS — M25511 Pain in right shoulder: Secondary | ICD-10-CM | POA: Diagnosis not present

## 2022-07-07 ENCOUNTER — Other Ambulatory Visit: Payer: Self-pay | Admitting: Gastroenterology

## 2022-07-07 DIAGNOSIS — R198 Other specified symptoms and signs involving the digestive system and abdomen: Secondary | ICD-10-CM

## 2022-07-07 DIAGNOSIS — Z539 Procedure and treatment not carried out, unspecified reason: Secondary | ICD-10-CM

## 2022-07-27 DIAGNOSIS — H04123 Dry eye syndrome of bilateral lacrimal glands: Secondary | ICD-10-CM | POA: Diagnosis not present

## 2022-07-27 DIAGNOSIS — H25813 Combined forms of age-related cataract, bilateral: Secondary | ICD-10-CM | POA: Diagnosis not present

## 2022-07-27 DIAGNOSIS — H40013 Open angle with borderline findings, low risk, bilateral: Secondary | ICD-10-CM | POA: Diagnosis not present

## 2022-07-28 ENCOUNTER — Encounter: Payer: Self-pay | Admitting: Neurology

## 2022-07-28 ENCOUNTER — Ambulatory Visit: Payer: BC Managed Care – PPO | Admitting: Neurology

## 2022-07-28 VITALS — BP 124/77 | HR 68 | Ht 63.5 in | Wt 185.0 lb

## 2022-07-28 DIAGNOSIS — R9089 Other abnormal findings on diagnostic imaging of central nervous system: Secondary | ICD-10-CM | POA: Insufficient documentation

## 2022-07-28 NOTE — Progress Notes (Signed)
Chief Complaint  Patient presents with   New Patient (Initial Visit)    Rm 15. Alone. NP/Paper/[name] MD [facility]/Evidence of a mini stroke, visual field deficits and small vessel disease. Saw Ophthalmology Dr. Sharyn Dross on 07/27/2022.      ASSESSMENT AND PLAN  Kathryn Shaw is a 66 y.o. female   Previous MRI brain described mild small vessel disease in 2015  Vascular risk factor of aging, hypertension, hyperlipidemia, obesity  Discussed with patient, she has no new neurological symptoms to warrant further evaluation at this point,  Continue to address vascular risk factor  Encouraged her moderate exercise, increase water intake,  Return to clinic for new issues At  risk for obstructive sleep apnea  She has narrow oropharyngeal space, observing her sleep pattern if she complains of excessive daytime fatigue, sleepiness, may consider sleep study  DIAGNOSTIC DATA (LABS, IMAGING, TESTING) - I reviewed patient records, labs, notes, testing and imaging myself where available. Laboratory evaluation in June 2023, normal CMP creatinine of 0.76,  MRI angiogram June 02, 2022 follow-up [facility] imaging the previously described 2 mm aneurysm arising from the inferior aspect of the left cavernous ICA is no longer seen, there is no evidence of aneurysm no AVM mild narrowing of the right M1 segment, otherwise normal intracranial vasculature.  MEDICAL HISTORY:  Kathryn Shaw is a 66 year old female, seen in request by her primary care physician Dr. Katrinka Blazing, Son[facility] Masters for evaluation of abnormal MRI of the brain, initial evaluation was on July 28, 2022    I reviewed and summarized the referring note. PMHX. HTN HLD  She was in the process of buying long-term insurance, was informed to that previous MRI of the brain in September 2015 showed abnormality  When she was receiving her yearly checkup at [facility] in 2015, she was noted to have mild visual field deficit, but  herself did not notice any visual changes, for that reason, she was referred for MRI of the brain in September 2015, I was not able to bring up MRI brain imagings, which described mild to slightly moderate nonspecific white matter changes, likely represent small vessel disease  Able to personally review MRA of neck and brain, incidental findings of 2 mm aneurysm at the inferior aspect of proximal Segment of left internal carotid artery  Over the years patient asymptomatic doing very well, sedentary lifestyle, frequent awakening at nighttime recently she had a repeat MR angiogram in August 2023, I personally reviewed the film, previously seen 2 mm aneurysm arising from the inferior aspect of the left cavernous ICA no longer seen, mild narrowing of the right M1 segment,  Also reviewed ophthalmology evaluation by [facility] September 2021  Open-angle with borderline glaucoma, cortical age-related cataract OU, dry eye syndrome, posterior vitreous detachment OD, homonymous bilateral visual field deficit, left side, stable from previous visual field testing March 2018, no worsening,      PHYSICAL EXAM:   Vitals:   07/28/22 0824  BP: 124/77  Pulse: 68  Weight: 185 lb (83.9 kg)  Height: 5' 3.5" (1.613 m)   Body mass index is 32.26 kg/m.  PHYSICAL EXAMNIATION:  Gen: NAD, conversant, well nourised, well groomed                     Cardiovascular: Regular rate rhythm, no peripheral edema, warm, nontender. Eyes: Conjunctivae clear without exudates or hemorrhage Neck: Supple, no carotid bruits. Pulmonary: Clear to auscultation bilaterally   NEUROLOGICAL EXAM:  MENTAL  STATUS: Speech/cognition: Awake, alert, oriented to history taking and casual conversation CRANIAL NERVES: CN II: Visual fields are full to confrontation. Pupils are round equal and briskly reactive to light. CN III, IV, VI: extraocular movement are normal. No ptosis. CN V: Facial sensation is intact to light  touch CN VII: Face is symmetric with normal eye closure  CN VIII: Hearing is normal to causal conversation. CN IX, X: Phonation is normal. CN XI: Head turning and shoulder shrug are intact CN XII: Narrow oropharyngeal space  MOTOR: There is no pronator drift of out-stretched arms. Muscle bulk and tone are normal. Muscle strength is normal.  REFLEXES: Reflexes are 2+ and symmetric at the biceps, triceps, knees, and ankles. Plantar responses are flexor.  SENSORY: Intact to light touch, pinprick and vibratory sensation are intact in fingers and toes.  COORDINATION: There is no trunk or limb dysmetria noted.  GAIT/STANCE: Posture is normal. Gait is steady with normal steps, base, arm swing, and turning.   REVIEW OF SYSTEMS:  Full 14 system review of systems performed and notable only for as above All other review of systems were negative.   ALLERGIES: Allergies  Allergen Reactions   Clarithromycin Other (See Comments)   Codeine Nausea And Vomiting   Amoxicillin-Pot Clavulanate Rash    HOME MEDICATIONS: Current Outpatient Medications  Medication Sig Dispense Refill   aspirin EC 81 MG tablet Take 81 mg by mouth daily. Swallow whole.     Calcium Carb-Cholecalciferol (CALCIUM + D3 PO) Take 1 tablet by mouth 2 (two) times daily. 1200 mg calcium + d3     fluticasone (FLONASE) 50 MCG/ACT nasal spray Place 2 sprays into both nostrils daily.     hydrochlorothiazide (HYDRODIURIL) 25 MG tablet Take 25 mg by mouth daily.     Krill Oil 500 MG CAPS Take 1 capsule by mouth daily.     Multiple Vitamins-Minerals (PRESERVISION AREDS PO) Take 1 tablet by mouth 2 (two) times daily.     quinapril (ACCUPRIL) 10 MG tablet Take 10 mg by mouth daily.     simvastatin (ZOCOR) 20 MG tablet Take 20 mg by mouth at bedtime.     No current facility-administered medications for this visit.    PAST MEDICAL HISTORY: History reviewed. No pertinent past medical history.  PAST SURGICAL HISTORY: History  reviewed. No pertinent surgical history.  FAMILY HISTORY: Family History  Problem Relation Age of Onset   Hypertension Mother    Hypertension Father    Hypertension Paternal Grandmother    Stroke Paternal Grandfather    Hypertension Paternal Grandfather     SOCIAL HISTORY: Social History   Socioeconomic History   Marital status: Married    Spouse name: Not on file   Number of children: Not on file   Years of education: Not on file   Highest education level: Not on file  Occupational History   Not on file  Tobacco Use   Smoking status: Never   Smokeless tobacco: Never  Substance and Sexual Activity   Alcohol use: Never   Drug use: Not on file   Sexual activity: Not on file  Other Topics Concern   Not on file  Social History Narrative   Not on file   Social Determinants of Health   Financial Resource Strain: Not on file  Food Insecurity: Not on file  Transportation Needs: Not on file  Physical Activity: Not on file  Stress: Not on file  Social Connections: Not on file  Intimate Partner Violence: Not on  file      Marcial Pacas, M.D. Ph.D.  [facility] [address], [place], [place] [address] Ph: [phone] Fax: [phone]  CC:  Carol Ada, [place],  [place] [address]  Carol Ada, MD

## 2022-09-09 ENCOUNTER — Ambulatory Visit
Admission: RE | Admit: 2022-09-09 | Discharge: 2022-09-09 | Disposition: A | Discharge status: Home / Self Care | Payer: BC Managed Care – PPO | Source: Ambulatory Visit | Attending: Gastroenterology | Admitting: Gastroenterology

## 2022-09-09 DIAGNOSIS — K5730 Diverticulosis of large intestine without perforation or abscess without bleeding: Secondary | ICD-10-CM | POA: Diagnosis not present

## 2022-09-09 DIAGNOSIS — Z539 Procedure and treatment not carried out, unspecified reason: Secondary | ICD-10-CM

## 2022-09-09 DIAGNOSIS — I700 Atherosclerosis of aorta: Secondary | ICD-10-CM | POA: Diagnosis not present

## 2022-09-09 DIAGNOSIS — K8020 Calculus of gallbladder without cholecystitis without obstruction: Secondary | ICD-10-CM | POA: Diagnosis not present

## 2022-09-09 DIAGNOSIS — K6389 Other specified diseases of intestine: Secondary | ICD-10-CM | POA: Diagnosis not present

## 2022-09-09 DIAGNOSIS — R198 Other specified symptoms and signs involving the digestive system and abdomen: Secondary | ICD-10-CM

## 2022-09-30 ENCOUNTER — Other Ambulatory Visit: Payer: BC Managed Care – PPO

## 2022-11-10 DIAGNOSIS — I10 Essential (primary) hypertension: Secondary | ICD-10-CM | POA: Diagnosis not present

## 2022-11-10 DIAGNOSIS — E7800 Pure hypercholesterolemia, unspecified: Secondary | ICD-10-CM | POA: Diagnosis not present

## 2022-11-10 DIAGNOSIS — Z23 Encounter for immunization: Secondary | ICD-10-CM | POA: Diagnosis not present

## 2022-11-10 DIAGNOSIS — Z0000 Encounter for general adult medical examination without abnormal findings: Secondary | ICD-10-CM | POA: Diagnosis not present

## 2022-12-16 DIAGNOSIS — M1811 Unilateral primary osteoarthritis of first carpometacarpal joint, right hand: Secondary | ICD-10-CM | POA: Diagnosis not present

## 2023-01-13 DIAGNOSIS — M1811 Unilateral primary osteoarthritis of first carpometacarpal joint, right hand: Secondary | ICD-10-CM | POA: Diagnosis not present

## 2023-02-08 DIAGNOSIS — D2362 Other benign neoplasm of skin of left upper limb, including shoulder: Secondary | ICD-10-CM | POA: Diagnosis not present

## 2023-02-08 DIAGNOSIS — D2372 Other benign neoplasm of skin of left lower limb, including hip: Secondary | ICD-10-CM | POA: Diagnosis not present

## 2023-02-08 DIAGNOSIS — D2371 Other benign neoplasm of skin of right lower limb, including hip: Secondary | ICD-10-CM | POA: Diagnosis not present

## 2023-02-08 DIAGNOSIS — L821 Other seborrheic keratosis: Secondary | ICD-10-CM | POA: Diagnosis not present

## 2023-02-17 DIAGNOSIS — M19031 Primary osteoarthritis, right wrist: Secondary | ICD-10-CM | POA: Diagnosis not present

## 2023-02-17 DIAGNOSIS — M1811 Unilateral primary osteoarthritis of first carpometacarpal joint, right hand: Secondary | ICD-10-CM | POA: Diagnosis not present

## 2023-05-11 DIAGNOSIS — B9689 Other specified bacterial agents as the cause of diseases classified elsewhere: Secondary | ICD-10-CM | POA: Diagnosis not present

## 2023-05-11 DIAGNOSIS — E7800 Pure hypercholesterolemia, unspecified: Secondary | ICD-10-CM | POA: Diagnosis not present

## 2023-05-11 DIAGNOSIS — J019 Acute sinusitis, unspecified: Secondary | ICD-10-CM | POA: Diagnosis not present

## 2023-05-11 DIAGNOSIS — I10 Essential (primary) hypertension: Secondary | ICD-10-CM | POA: Diagnosis not present

## 2023-05-13 DIAGNOSIS — E349 Endocrine disorder, unspecified: Secondary | ICD-10-CM | POA: Diagnosis not present

## 2023-05-13 DIAGNOSIS — N958 Other specified menopausal and perimenopausal disorders: Secondary | ICD-10-CM | POA: Diagnosis not present

## 2023-05-13 DIAGNOSIS — Z1231 Encounter for screening mammogram for malignant neoplasm of breast: Secondary | ICD-10-CM | POA: Diagnosis not present

## 2023-11-29 DIAGNOSIS — E7800 Pure hypercholesterolemia, unspecified: Secondary | ICD-10-CM | POA: Diagnosis not present

## 2023-11-29 DIAGNOSIS — K219 Gastro-esophageal reflux disease without esophagitis: Secondary | ICD-10-CM | POA: Diagnosis not present

## 2023-11-29 DIAGNOSIS — I10 Essential (primary) hypertension: Secondary | ICD-10-CM | POA: Diagnosis not present

## 2023-11-29 DIAGNOSIS — Z0000 Encounter for general adult medical examination without abnormal findings: Secondary | ICD-10-CM | POA: Diagnosis not present

## 2024-01-05 DIAGNOSIS — E785 Hyperlipidemia, unspecified: Secondary | ICD-10-CM | POA: Diagnosis not present

## 2024-01-05 DIAGNOSIS — R7309 Other abnormal glucose: Secondary | ICD-10-CM | POA: Diagnosis not present

## 2024-01-10 DIAGNOSIS — E119 Type 2 diabetes mellitus without complications: Secondary | ICD-10-CM | POA: Diagnosis not present

## 2024-01-10 DIAGNOSIS — E7800 Pure hypercholesterolemia, unspecified: Secondary | ICD-10-CM | POA: Diagnosis not present

## 2024-01-17 DIAGNOSIS — E119 Type 2 diabetes mellitus without complications: Secondary | ICD-10-CM | POA: Diagnosis not present

## 2024-01-17 DIAGNOSIS — Z6832 Body mass index (BMI) 32.0-32.9, adult: Secondary | ICD-10-CM | POA: Diagnosis not present

## 2024-01-17 DIAGNOSIS — E6609 Other obesity due to excess calories: Secondary | ICD-10-CM | POA: Diagnosis not present

## 2024-01-17 DIAGNOSIS — E7800 Pure hypercholesterolemia, unspecified: Secondary | ICD-10-CM | POA: Diagnosis not present

## 2024-02-09 DIAGNOSIS — D2261 Melanocytic nevi of right upper limb, including shoulder: Secondary | ICD-10-CM | POA: Diagnosis not present

## 2024-02-09 DIAGNOSIS — D398 Neoplasm of uncertain behavior of other specified female genital organs: Secondary | ICD-10-CM | POA: Diagnosis not present

## 2024-02-09 DIAGNOSIS — L820 Inflamed seborrheic keratosis: Secondary | ICD-10-CM | POA: Diagnosis not present

## 2024-02-09 DIAGNOSIS — D485 Neoplasm of uncertain behavior of skin: Secondary | ICD-10-CM | POA: Diagnosis not present

## 2024-02-09 DIAGNOSIS — D2272 Melanocytic nevi of left lower limb, including hip: Secondary | ICD-10-CM | POA: Diagnosis not present

## 2024-02-09 DIAGNOSIS — R208 Other disturbances of skin sensation: Secondary | ICD-10-CM | POA: Diagnosis not present

## 2024-02-09 DIAGNOSIS — D225 Melanocytic nevi of trunk: Secondary | ICD-10-CM | POA: Diagnosis not present

## 2024-02-09 DIAGNOSIS — D1801 Hemangioma of skin and subcutaneous tissue: Secondary | ICD-10-CM | POA: Diagnosis not present

## 2024-02-09 DIAGNOSIS — D2262 Melanocytic nevi of left upper limb, including shoulder: Secondary | ICD-10-CM | POA: Diagnosis not present

## 2024-02-20 DIAGNOSIS — E6689 Other obesity not elsewhere classified: Secondary | ICD-10-CM | POA: Diagnosis not present

## 2024-02-20 DIAGNOSIS — E7800 Pure hypercholesterolemia, unspecified: Secondary | ICD-10-CM | POA: Diagnosis not present

## 2024-02-20 DIAGNOSIS — Z6830 Body mass index (BMI) 30.0-30.9, adult: Secondary | ICD-10-CM | POA: Diagnosis not present

## 2024-02-20 DIAGNOSIS — E119 Type 2 diabetes mellitus without complications: Secondary | ICD-10-CM | POA: Diagnosis not present

## 2024-05-17 DIAGNOSIS — K080 Exfoliation of teeth due to systemic causes: Secondary | ICD-10-CM | POA: Diagnosis not present

## 2024-05-18 DIAGNOSIS — Z1231 Encounter for screening mammogram for malignant neoplasm of breast: Secondary | ICD-10-CM | POA: Diagnosis not present

## 2024-05-24 ENCOUNTER — Other Ambulatory Visit (HOSPITAL_COMMUNITY): Payer: Self-pay | Admitting: Family Medicine

## 2024-05-24 DIAGNOSIS — R921 Mammographic calcification found on diagnostic imaging of breast: Secondary | ICD-10-CM

## 2024-05-29 DIAGNOSIS — E1169 Type 2 diabetes mellitus with other specified complication: Secondary | ICD-10-CM | POA: Diagnosis not present

## 2024-05-29 DIAGNOSIS — I10 Essential (primary) hypertension: Secondary | ICD-10-CM | POA: Diagnosis not present

## 2024-05-29 DIAGNOSIS — E7800 Pure hypercholesterolemia, unspecified: Secondary | ICD-10-CM | POA: Diagnosis not present

## 2024-06-14 ENCOUNTER — Ambulatory Visit (HOSPITAL_COMMUNITY)
Admission: RE | Admit: 2024-06-14 | Discharge: 2024-06-14 | Disposition: A | Discharge status: Home / Self Care | Source: Ambulatory Visit | Attending: Family Medicine | Admitting: Family Medicine

## 2024-06-14 DIAGNOSIS — R921 Mammographic calcification found on diagnostic imaging of breast: Secondary | ICD-10-CM | POA: Insufficient documentation

## 2024-07-24 ENCOUNTER — Ambulatory Visit (HOSPITAL_BASED_OUTPATIENT_CLINIC_OR_DEPARTMENT_OTHER): Admitting: Cardiology

## 2024-07-24 ENCOUNTER — Encounter (HOSPITAL_BASED_OUTPATIENT_CLINIC_OR_DEPARTMENT_OTHER): Payer: Self-pay | Admitting: Cardiology

## 2024-07-24 VITALS — BP 123/82 | HR 100 | Ht 63.5 in | Wt 157.0 lb

## 2024-07-24 DIAGNOSIS — I10 Essential (primary) hypertension: Secondary | ICD-10-CM

## 2024-07-24 DIAGNOSIS — E782 Mixed hyperlipidemia: Secondary | ICD-10-CM

## 2024-07-24 DIAGNOSIS — R931 Abnormal findings on diagnostic imaging of heart and coronary circulation: Secondary | ICD-10-CM | POA: Diagnosis not present

## 2024-07-24 DIAGNOSIS — Z712 Person consulting for explanation of examination or test findings: Secondary | ICD-10-CM

## 2024-07-24 DIAGNOSIS — E119 Type 2 diabetes mellitus without complications: Secondary | ICD-10-CM

## 2024-07-24 DIAGNOSIS — I2510 Atherosclerotic heart disease of native coronary artery without angina pectoris: Secondary | ICD-10-CM | POA: Diagnosis not present

## 2024-07-24 NOTE — Patient Instructions (Signed)
 Medication Instructions:  No changes *If you need a refill on your cardiac medications before your next appointment, please call your pharmacy*  Lab Work: Return in 2 months for blood work (lipids, Lpa)  If you have labs (blood work) drawn today and your tests are completely normal, you will receive your results only by: MyChart Message (if you have MyChart) OR A paper copy in the mail If you have any lab test that is abnormal or we need to change your treatment, we will call you to review the results.  Testing/Procedures: none  Follow-Up: At [facility], you and your health needs are our priority.  As part of our continuing mission to provide you with exceptional heart care, our providers are all part of one team.  This team includes your primary Cardiologist (physician) and Advanced Practice Providers or APPs (Physician Assistants and Nurse Practitioners) who all work together to provide you with the care you need, when you need it.  Your next appointment:   12 month(s)  Provider:   Shelda Bruckner, MD, Rosaline Bane, NP, or Reche Finder, NP

## 2024-07-24 NOTE — Progress Notes (Signed)
 Cardiology Office Note:  .   Date:  07/24/2024  ID:  Kathryn Shaw, DOB [birth year], MRN [id number] PCP: Claudene Pellet, MD  [facility] Providers Cardiologist:  Shelda Bruckner, MD {  History of Present Illness: .   Kathryn Shaw is a 68 y.o. female with PMH hypertension, type II diabetes, hypercholesterolemia. She is seen as a new patient evaluation for elevated calcium score at the request of Dr. Claudene.  Referral from 06/25/24 reviewed. She was referred for evaluation and management of elevated coronary calcium score, with risk factors of type II diabetes and hypertension as well. Note from 05/29/24 from Dr. Claudene reviewed. Noted well controlled hypertension on lisinopril and hydrochlorothiazide. Has been on simvastatin 40 mg daily without issues. She is on metformin for her type II diabetes and had lost 30 lbs. Was noted that her breast arterial calcification score was positive on the right side during mammogram, coronary calcium score ordered. Calcium score done 06/14/24 showed score of 311 (93rd %ile). Aortic atherosclerosis also noted.  She is having no symptoms. She notes that this all started when she got the extra added breast arterial calcification evaluation on her mammogram. She has never been told that she has any issues with her heart.   Was on simvastatin for many years, just changed to rosuvastatin after results of calcium score.   FH: Kathryn Shaw had a heart attack at age 67, passed from this. Kathryn Shaw had a CVA and was paralyzed on his left side. Father had unclear heart condition, was a IT sales professional, slumped over while driving and was told he had heart issues. Died in his 50s, unclear cause, she will investigate. Mother died of dementia. No siblings.  ROS: Denies chest pain, shortness of breath at rest or with normal exertion. No PND, orthopnea, LE edema or unexpected weight gain. No syncope or palpitations. ROS otherwise negative except as noted.   Studies Reviewed: SABRA     EKG:  EKG Interpretation Date/Time:  Tuesday July 24 2024 16:04:23 EDT Ventricular Rate:  100 PR Interval:  160 QRS Duration:  94 QT Interval:  340 QTC Calculation: 438 R Axis:   75  Text Interpretation: Normal sinus rhythm Normal ECG Confirmed by Bruckner Shelda [phone]) on 07/24/2024 4:37:38 PM    Physical Exam:   VS:  BP 123/82   Pulse 100   Ht 5' 3.5 (1.613 m)   Wt 157 lb (71.2 kg)   SpO2 95%   BMI 27.38 kg/m    Wt Readings from Last 3 Encounters:  07/24/24 157 lb (71.2 kg)  07/28/22 185 lb (83.9 kg)    GEN: Well nourished, well developed in no acute distress HEENT: Normal, moist mucous membranes NECK: No JVD CARDIAC: regular rhythm, normal S1 and S2, no rubs or gallops. No murmur. VASCULAR: Radial and DP pulses 2+ bilaterally. No carotid bruits RESPIRATORY:  Clear to auscultation without rales, wheezing or rhonchi  ABDOMEN: Soft, non-tender, non-distended MUSCULOSKELETAL:  Ambulates independently SKIN: Warm and dry, no edema NEUROLOGIC:  Alert and oriented x 3. No focal neuro deficits noted. PSYCHIATRIC:  Normal affect    ASSESSMENT AND PLAN: .   Elevated coronary calcium score, consistent with nonobstructive CAD Family history of ASCVD Mixed hyperlipidemia -We reviewed the calcium score at length. We discussed the pathophysiology of cholesterol plaque formation, the role of calcium and why it is a marker, how plaque is key to acute MI/CVA, and how known plaque is managed with medications.   -has been on simvastatin, LDL was 94.  Has just been changed to rosuvastatin, will order recheck lipids for 2 mos -on aspirin 81 mg daily, tolerating -reviewed red flag warning signs that need immediate medical attention  -discussed lp(a), she is amenable. Discussed that there is not specific treatment for it this time. She does have adult children, so would recommend that being checked in them if hers is elevated  Hypertension -continue lisinopril and HCTZ  Type  II diabetes -well controlled, last A1c 6.1. BMI also right at 27 -we did discuss that GLPs have CV risk reduction  CV risk counseling and prevention -recommend heart healthy/Mediterranean diet, with whole grains, fruits, vegetable, fish, lean meats, nuts, and olive oil. Limit salt. -recommend moderate walking, 3-5 times/week for 30-50 minutes each session. Aim for at least 150 minutes/week. Goal should be pace of 3 miles/hours, or walking 1.5 miles in 30 minutes -recommend avoidance of tobacco products. Avoid excess alcohol.  Dispo: 1 year or sooner as needed  Signed, Shelda Bruckner, MD   Shelda Bruckner, MD, PhD, [facility] [place]  [facility]  [place]  [facility] at [facility] [address], [address] [place], [place] [address] [phone]

## 2024-07-25 ENCOUNTER — Encounter (HOSPITAL_BASED_OUTPATIENT_CLINIC_OR_DEPARTMENT_OTHER): Payer: Self-pay

## 2024-07-25 ENCOUNTER — Encounter (HOSPITAL_BASED_OUTPATIENT_CLINIC_OR_DEPARTMENT_OTHER): Payer: Self-pay | Admitting: Cardiology

## 2024-07-25 NOTE — Telephone Encounter (Signed)
 Added HCM to family history.  Will route to Dr. Lonni to make aware.

## 2024-08-17 DIAGNOSIS — H25813 Combined forms of age-related cataract, bilateral: Secondary | ICD-10-CM | POA: Diagnosis not present

## 2024-08-17 DIAGNOSIS — H40013 Open angle with borderline findings, low risk, bilateral: Secondary | ICD-10-CM | POA: Diagnosis not present

## 2024-09-24 DIAGNOSIS — E782 Mixed hyperlipidemia: Secondary | ICD-10-CM | POA: Diagnosis not present

## 2024-09-24 DIAGNOSIS — I2510 Atherosclerotic heart disease of native coronary artery without angina pectoris: Secondary | ICD-10-CM | POA: Diagnosis not present

## 2024-09-26 LAB — LIPID PANEL
Chol/HDL Ratio: 2.5 ratio (ref 0.0–4.4)
Cholesterol, Total: 145 mg/dL (ref 100–199)
HDL: 58 mg/dL (ref >39)
LDL Chol Calc (NIH): 67 mg/dL (ref 0–99)
Triglycerides: 113 mg/dL (ref 0–149)
VLDL Cholesterol Cal: 20 mg/dL (ref 5–40)

## 2024-09-26 LAB — LIPOPROTEIN A (LPA): Lipoprotein (a): 89.9 nmol/L — ABNORMAL HIGH (ref <75.0)

## 2024-10-09 ENCOUNTER — Ambulatory Visit (HOSPITAL_BASED_OUTPATIENT_CLINIC_OR_DEPARTMENT_OTHER): Payer: Self-pay | Admitting: Cardiology
